# Patient Record
Sex: Female | Born: 1980 | Race: White | Hispanic: No | Smoking: Current every day smoker
Health system: Southern US, Community
[De-identification: ages and names within clinical notes are randomized; demographics above are authoritative.]

## PROBLEM LIST (undated history)

## (undated) HISTORY — PX: CHOLECYSTECTOMY: SHX55

## (undated) HISTORY — PX: APPENDECTOMY: SHX54

## (undated) HISTORY — PX: FOOT SURGERY: SHX648

## (undated) HISTORY — PX: TUBAL LIGATION: SHX77

---

## 2000-02-22 ENCOUNTER — Encounter: Payer: Self-pay | Admitting: Emergency Medicine

## 2000-02-22 ENCOUNTER — Emergency Department (HOSPITAL_COMMUNITY): Admission: EM | Admit: 2000-02-22 | Discharge: 2000-02-22 | Payer: Self-pay | Admitting: Emergency Medicine

## 2000-02-23 ENCOUNTER — Emergency Department (HOSPITAL_COMMUNITY): Admission: EM | Admit: 2000-02-23 | Discharge: 2000-02-23 | Payer: Self-pay | Admitting: Emergency Medicine

## 2000-05-02 ENCOUNTER — Emergency Department (HOSPITAL_COMMUNITY): Admission: EM | Admit: 2000-05-02 | Discharge: 2000-05-02 | Payer: Self-pay | Admitting: Emergency Medicine

## 2000-05-02 ENCOUNTER — Encounter: Payer: Self-pay | Admitting: Emergency Medicine

## 2000-05-03 ENCOUNTER — Emergency Department (HOSPITAL_COMMUNITY): Admission: EM | Admit: 2000-05-03 | Discharge: 2000-05-03 | Payer: Self-pay | Admitting: Emergency Medicine

## 2002-08-04 ENCOUNTER — Emergency Department (HOSPITAL_COMMUNITY): Admission: EM | Admit: 2002-08-04 | Discharge: 2002-08-05 | Payer: Self-pay | Admitting: Emergency Medicine

## 2002-09-04 ENCOUNTER — Inpatient Hospital Stay (HOSPITAL_COMMUNITY): Admission: AD | Admit: 2002-09-04 | Discharge: 2002-09-04 | Payer: Self-pay | Admitting: Obstetrics and Gynecology

## 2002-09-04 ENCOUNTER — Inpatient Hospital Stay (HOSPITAL_COMMUNITY): Admission: AD | Admit: 2002-09-04 | Discharge: 2002-09-04 | Payer: Self-pay | Admitting: *Deleted

## 2002-09-25 ENCOUNTER — Emergency Department (HOSPITAL_COMMUNITY): Admission: EM | Admit: 2002-09-25 | Discharge: 2002-09-25 | Payer: Self-pay | Admitting: Emergency Medicine

## 2002-11-21 ENCOUNTER — Encounter: Payer: Self-pay | Admitting: Family Medicine

## 2002-11-21 ENCOUNTER — Encounter: Admission: RE | Admit: 2002-11-21 | Discharge: 2002-11-21 | Payer: Self-pay | Admitting: Family Medicine

## 2006-11-09 ENCOUNTER — Emergency Department (HOSPITAL_COMMUNITY): Admission: EM | Admit: 2006-11-09 | Discharge: 2006-11-09 | Payer: Self-pay | Admitting: Emergency Medicine

## 2007-04-24 ENCOUNTER — Emergency Department (HOSPITAL_COMMUNITY): Admission: EM | Admit: 2007-04-24 | Discharge: 2007-04-24 | Payer: Self-pay | Admitting: Emergency Medicine

## 2007-08-10 ENCOUNTER — Emergency Department (HOSPITAL_COMMUNITY): Admission: EM | Admit: 2007-08-10 | Discharge: 2007-08-10 | Payer: Self-pay | Admitting: Emergency Medicine

## 2007-09-12 ENCOUNTER — Emergency Department (HOSPITAL_COMMUNITY): Admission: EM | Admit: 2007-09-12 | Discharge: 2007-09-12 | Payer: Self-pay | Admitting: Emergency Medicine

## 2008-07-21 ENCOUNTER — Emergency Department (HOSPITAL_COMMUNITY): Admission: EM | Admit: 2008-07-21 | Discharge: 2008-07-21 | Payer: Self-pay | Admitting: Family Medicine

## 2008-08-12 ENCOUNTER — Ambulatory Visit (HOSPITAL_COMMUNITY): Admission: RE | Admit: 2008-08-12 | Discharge: 2008-08-12 | Payer: Self-pay | Admitting: Obstetrics

## 2008-08-20 ENCOUNTER — Encounter: Admission: RE | Admit: 2008-08-20 | Discharge: 2008-08-20 | Payer: Self-pay | Admitting: Cardiology

## 2008-11-05 ENCOUNTER — Encounter: Admission: RE | Admit: 2008-11-05 | Discharge: 2008-11-05 | Payer: Self-pay | Admitting: General Surgery

## 2009-03-10 ENCOUNTER — Emergency Department (HOSPITAL_COMMUNITY): Admission: EM | Admit: 2009-03-10 | Discharge: 2009-03-10 | Payer: Self-pay | Admitting: Emergency Medicine

## 2009-05-09 ENCOUNTER — Emergency Department (HOSPITAL_COMMUNITY): Admission: EM | Admit: 2009-05-09 | Discharge: 2009-05-09 | Payer: Self-pay | Admitting: Emergency Medicine

## 2010-05-30 IMAGING — US US TRANSVAGINAL NON-OB
1 series · 14 of 25 positions shown · non-contrast
Comparison: None

CLINICAL DATA: Dysfunctional uterine bleeding

TRANSABDOMINAL AND TRANSVAGINAL ULTRASOUND OF PELVIS
TECHNIQUE: Both transabdominal and transvaginal ultrasound
examinations of the pelvis were performed including evaluation of
the uterus, ovaries, adnexal regions, and pelvic cul-de-sac.

[Series 1: us transvaginal non-ob · 14 of 52 slices shown]
[im 1/52]
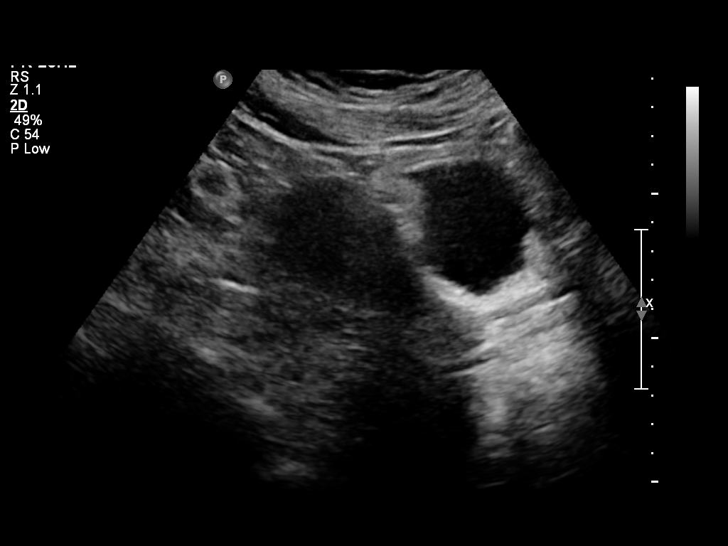
[im 5/52]
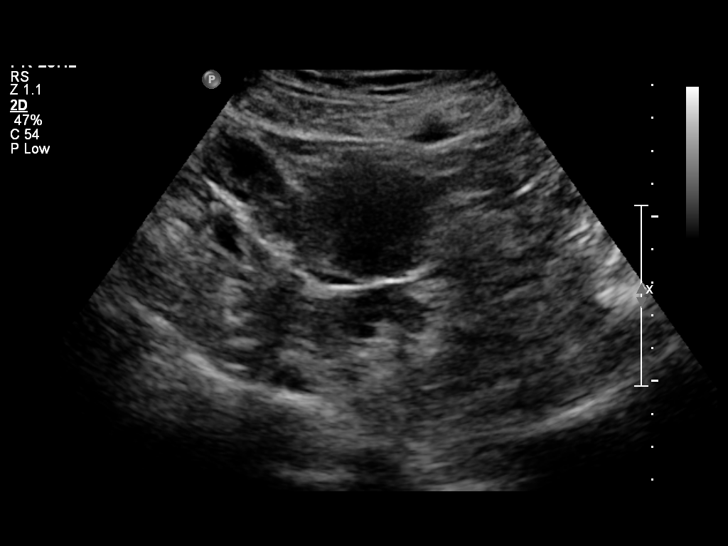
[im 9/52]
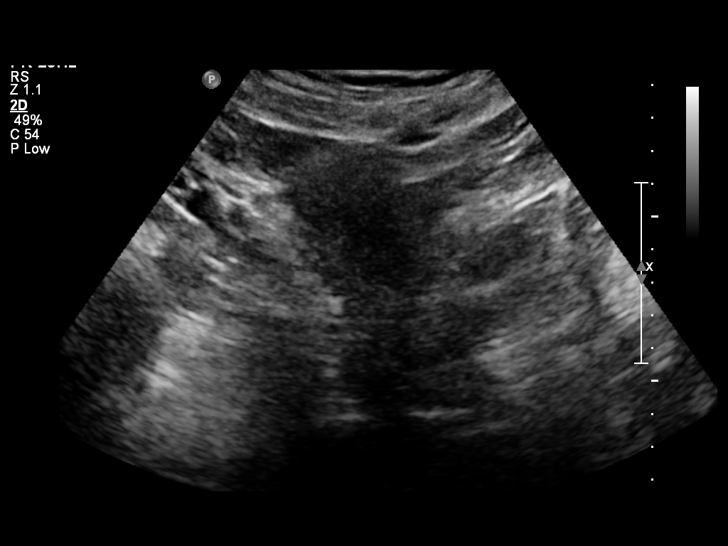
[im 13/52]
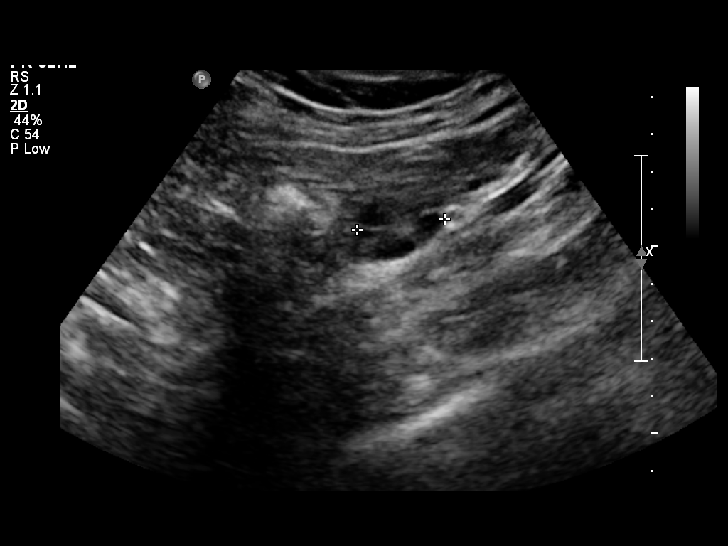
[im 18/52]
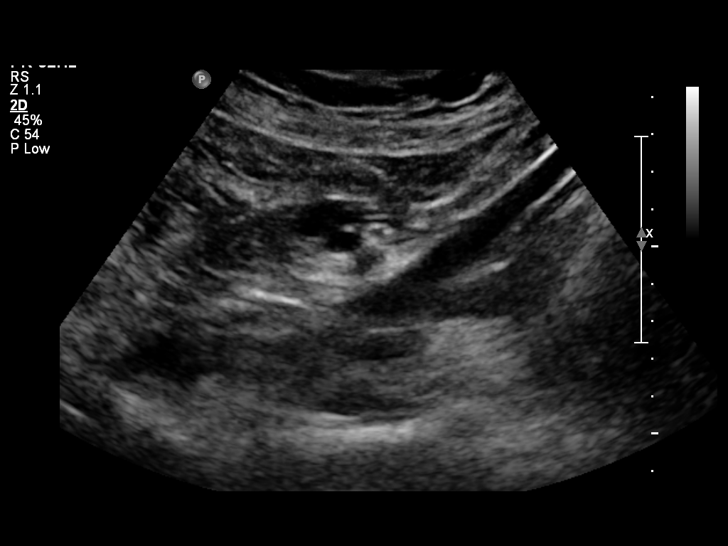
[im 20/52]
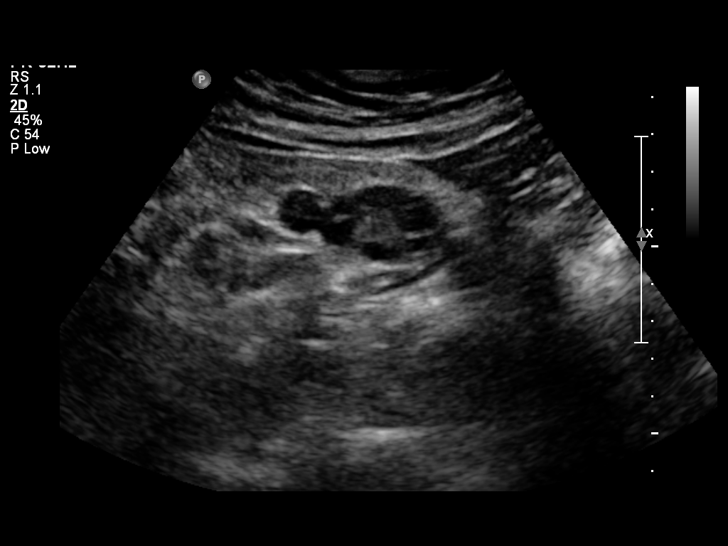
[im 24/52]
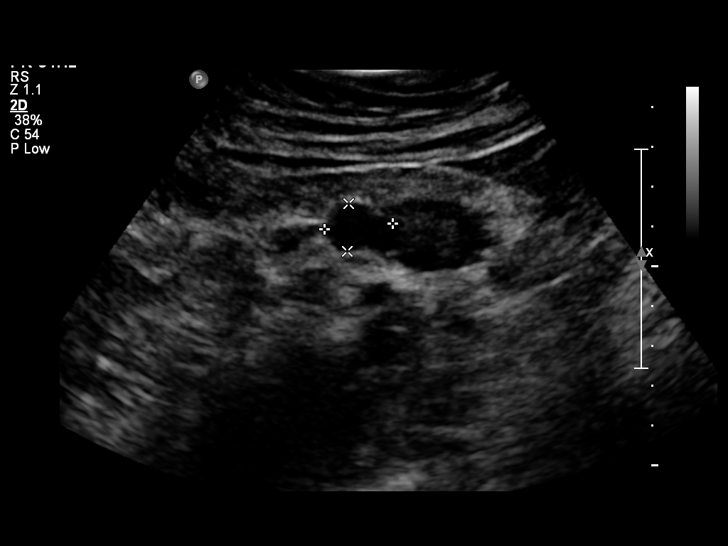
[im 28/52]
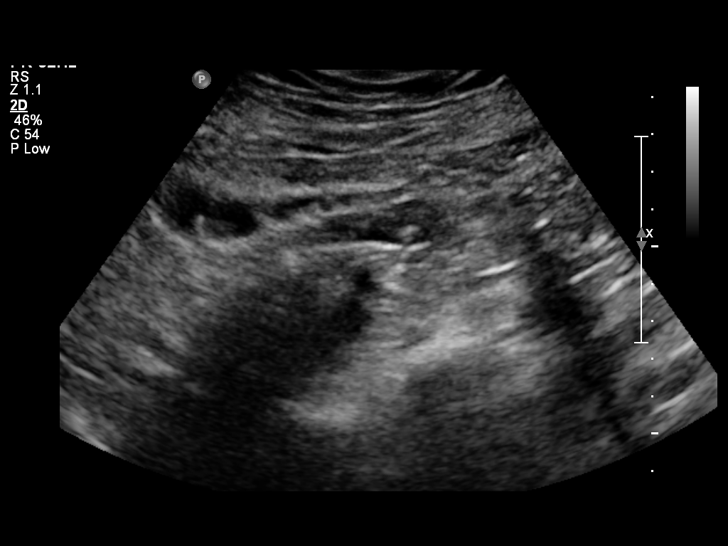
[im 32/52]
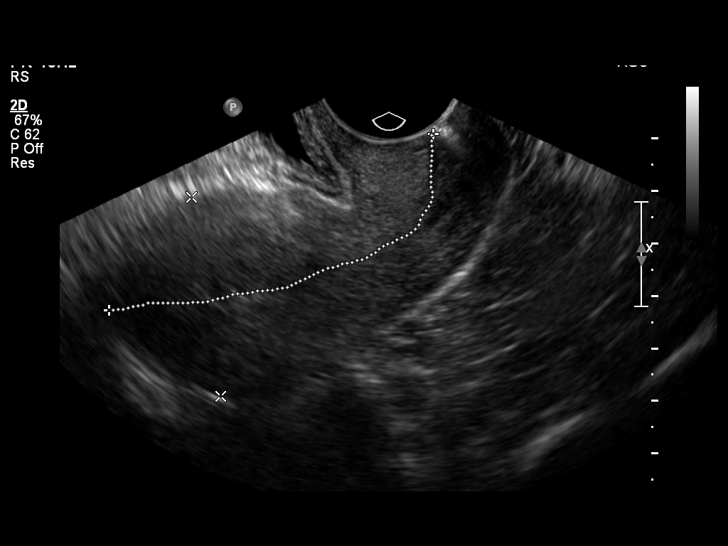
[im 35/52]
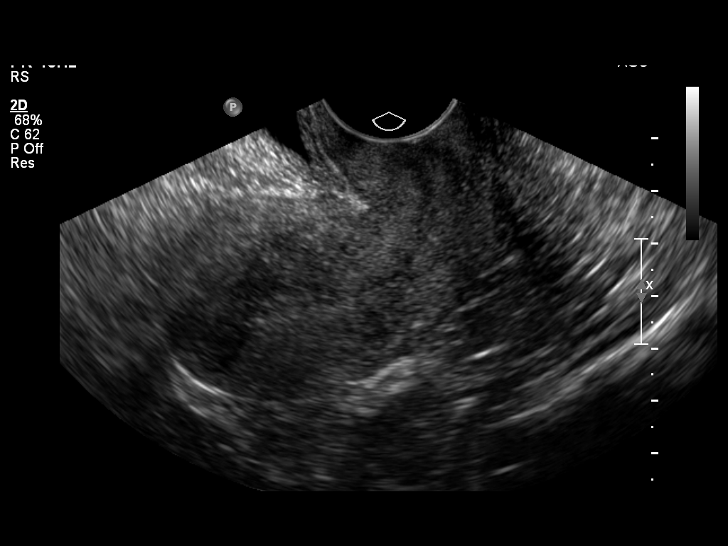
[im 39/52]
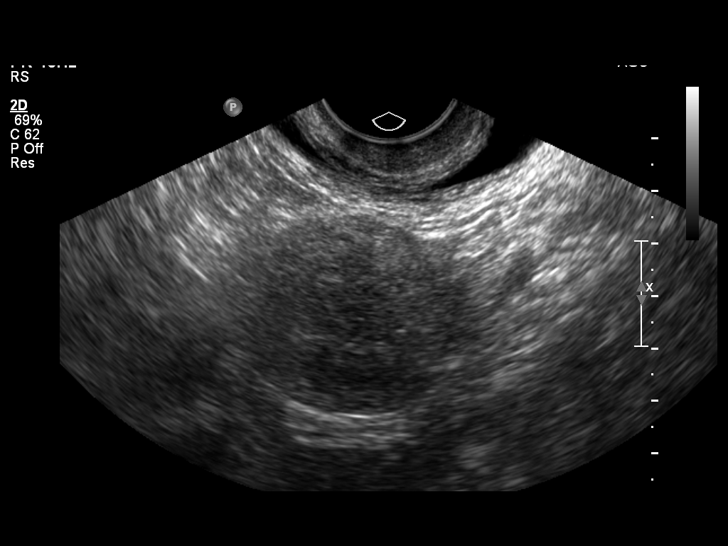
[im 43/52]
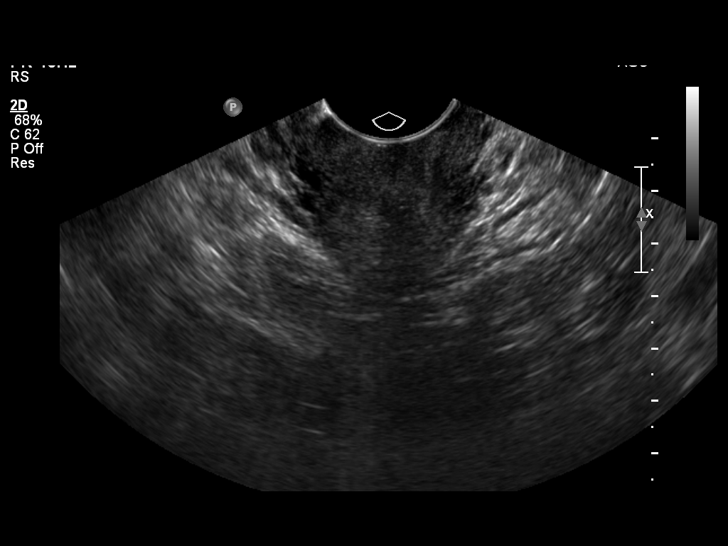
[im 47/52]
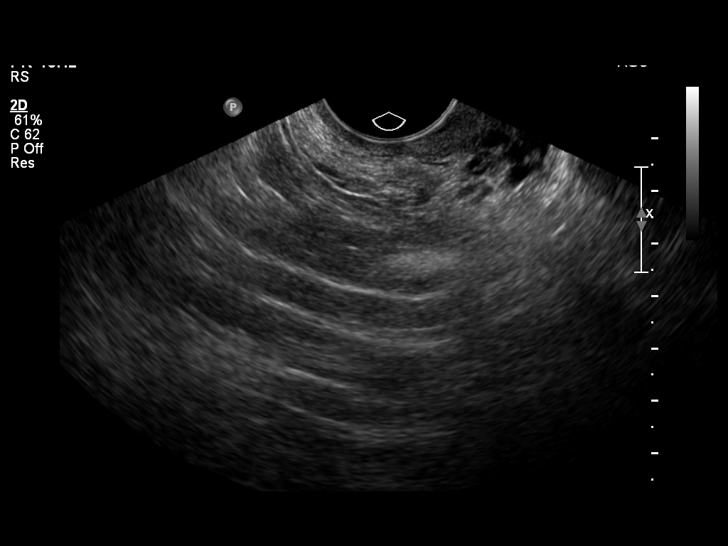
[im 52/52]
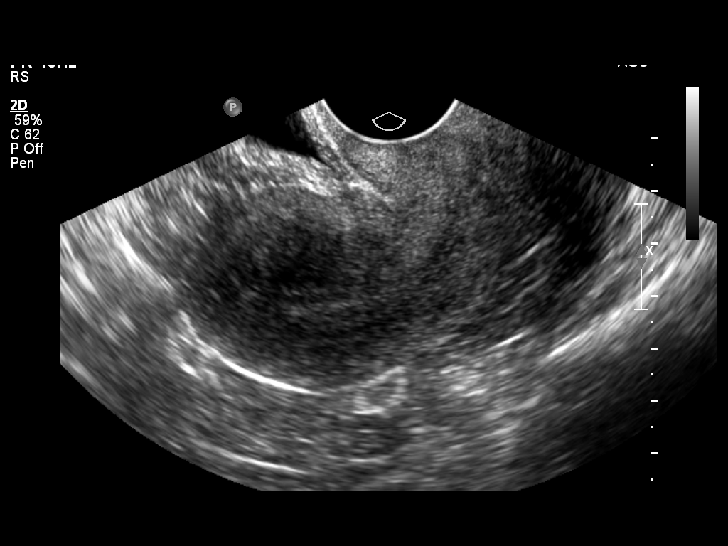

[14 of 25 positions shown; findings below may reference images not displayed]

FINDINGS: The uterus is normal in size, measuring 8.4 x 4.2 x
cm.  The myometrial echotexture is  heterogeneous suggesting an
element of adenomyosis.  The endometrial stripe is within normal
limits, measuring 5 mm in width.  No focal fibroids are seen.

Both ovaries have a normal size and appearance.  The right ovary
measures  3.4 x 2.0 x 2.1 cm, and the left ovary measures 4.4 x
x 2.3 cm.  No adnexal masses or free pelvic fluid are identified.
IMPRESSION: Probable adenomyosis with heterogeneous myometrial echotexture.

## 2010-07-19 ENCOUNTER — Encounter: Payer: Self-pay | Admitting: General Surgery

## 2010-10-01 LAB — POCT I-STAT, CHEM 8
BUN: 5 mg/dL — ABNORMAL LOW (ref 6–23)
Calcium, Ion: 0.95 mmol/L — ABNORMAL LOW (ref 1.12–1.32)
HCT: 43 % (ref 36.0–46.0)
Sodium: 138 mEq/L (ref 135–145)
TCO2: 21 mmol/L (ref 0–100)

## 2010-10-01 LAB — URINALYSIS, ROUTINE W REFLEX MICROSCOPIC
Glucose, UA: NEGATIVE mg/dL
Nitrite: NEGATIVE
Specific Gravity, Urine: 1.028 (ref 1.005–1.030)
pH: 6 (ref 5.0–8.0)

## 2010-10-01 LAB — URINE MICROSCOPIC-ADD ON

## 2010-10-01 LAB — CBC
MCHC: 33.9 g/dL (ref 30.0–36.0)
Platelets: 300 10*3/uL (ref 150–400)
RDW: 13.1 % (ref 11.5–15.5)

## 2010-10-01 LAB — DIFFERENTIAL
Basophils Absolute: 0.1 10*3/uL (ref 0.0–0.1)
Basophils Relative: 1 % (ref 0–1)
Eosinophils Relative: 1 % (ref 0–5)
Lymphocytes Relative: 34 % (ref 12–46)
Monocytes Absolute: 0.6 10*3/uL (ref 0.1–1.0)
Neutro Abs: 6.9 10*3/uL (ref 1.7–7.7)

## 2010-10-01 LAB — PREGNANCY, URINE: Preg Test, Ur: NEGATIVE

## 2010-10-11 LAB — GC/CHLAMYDIA PROBE AMP, GENITAL: Chlamydia, DNA Probe: NEGATIVE

## 2010-10-11 LAB — POCT URINALYSIS DIP (DEVICE)
Ketones, ur: NEGATIVE mg/dL
Protein, ur: NEGATIVE mg/dL
Specific Gravity, Urine: 1.01 (ref 1.005–1.030)
Urobilinogen, UA: 0.2 mg/dL (ref 0.0–1.0)

## 2010-10-11 LAB — WET PREP, GENITAL
Trich, Wet Prep: NONE SEEN
WBC, Wet Prep HPF POC: NONE SEEN

## 2011-03-21 LAB — URINALYSIS, ROUTINE W REFLEX MICROSCOPIC
Bilirubin Urine: NEGATIVE
Nitrite: NEGATIVE
Protein, ur: NEGATIVE
Specific Gravity, Urine: 1.014
Urobilinogen, UA: 1

## 2011-03-21 LAB — DIFFERENTIAL
Lymphocytes Relative: 26
Lymphs Abs: 2.5
Monocytes Absolute: 0.5
Monocytes Relative: 6
Neutro Abs: 6.2
Neutrophils Relative %: 66

## 2011-03-21 LAB — WET PREP, GENITAL
Trich, Wet Prep: NONE SEEN
Yeast Wet Prep HPF POC: NONE SEEN

## 2011-03-21 LAB — COMPREHENSIVE METABOLIC PANEL
Albumin: 3.4 — ABNORMAL LOW
BUN: 4 — ABNORMAL LOW
Calcium: 9
Creatinine, Ser: 0.71
Glucose, Bld: 111 — ABNORMAL HIGH
Potassium: 4.2
Total Protein: 6.3

## 2011-03-21 LAB — CBC
HCT: 41
Hemoglobin: 13.9
MCHC: 33.8
MCV: 84.7
Platelets: 263
RDW: 13.4

## 2011-03-21 LAB — POCT PREGNANCY, URINE: Operator id: 288831

## 2011-03-21 LAB — GC/CHLAMYDIA PROBE AMP, GENITAL
Chlamydia, DNA Probe: NEGATIVE
GC Probe Amp, Genital: NEGATIVE

## 2015-01-25 ENCOUNTER — Encounter (HOSPITAL_COMMUNITY): Payer: Self-pay | Admitting: Emergency Medicine

## 2015-01-25 ENCOUNTER — Emergency Department (HOSPITAL_COMMUNITY): Payer: Medicaid Other

## 2015-01-25 DIAGNOSIS — Z720 Tobacco use: Secondary | ICD-10-CM | POA: Insufficient documentation

## 2015-01-25 DIAGNOSIS — R079 Chest pain, unspecified: Secondary | ICD-10-CM | POA: Insufficient documentation

## 2015-01-25 DIAGNOSIS — R61 Generalized hyperhidrosis: Secondary | ICD-10-CM | POA: Insufficient documentation

## 2015-01-25 LAB — CBC
HEMATOCRIT: 39.9 % (ref 36.0–46.0)
HEMOGLOBIN: 13.2 g/dL (ref 12.0–15.0)
MCH: 27.6 pg (ref 26.0–34.0)
MCHC: 33.1 g/dL (ref 30.0–36.0)
MCV: 83.3 fL (ref 78.0–100.0)
Platelets: 268 10*3/uL (ref 150–400)
RBC: 4.79 MIL/uL (ref 3.87–5.11)
RDW: 14.5 % (ref 11.5–15.5)
WBC: 10.8 10*3/uL — AB (ref 4.0–10.5)

## 2015-01-25 NOTE — ED Notes (Signed)
Pt. reports intermittent central /left chest pain onset last night , denies SOB , no nausea or diaphoresis .

## 2015-01-26 ENCOUNTER — Emergency Department (HOSPITAL_COMMUNITY)
Admission: EM | Admit: 2015-01-26 | Discharge: 2015-01-26 | Disposition: A | Discharge status: Home / Self Care | Payer: Medicaid Other | Attending: Emergency Medicine | Admitting: Emergency Medicine

## 2015-01-26 DIAGNOSIS — R079 Chest pain, unspecified: Secondary | ICD-10-CM

## 2015-01-26 LAB — BASIC METABOLIC PANEL
ANION GAP: 7 (ref 5–15)
BUN: <5 mg/dL — ABNORMAL LOW (ref 6–20)
CALCIUM: 9.2 mg/dL (ref 8.9–10.3)
CO2: 23 mmol/L (ref 22–32)
Chloride: 108 mmol/L (ref 101–111)
Creatinine, Ser: 0.57 mg/dL (ref 0.44–1.00)
GFR calc non Af Amer: >60 mL/min (ref >60)
GLUCOSE: 101 mg/dL — AB (ref 65–99)
Potassium: 3.8 mmol/L (ref 3.5–5.1)
Sodium: 138 mmol/L (ref 135–145)

## 2015-01-26 LAB — I-STAT TROPONIN, ED
TROPONIN I, POC: 0 ng/mL (ref 0.00–0.08)
TROPONIN I, POC: 0 ng/mL (ref 0.00–0.08)

## 2015-01-26 MED ORDER — KETOROLAC TROMETHAMINE 30 MG/ML IJ SOLN
30.0000 mg | Freq: Once | INTRAMUSCULAR | Status: AC
  Administered 2015-01-26: 30 mg via INTRAVENOUS
  Filled 2015-01-26: qty 1

## 2015-01-26 MED ORDER — GI COCKTAIL ~~LOC~~
30.0000 mL | Freq: Once | ORAL | Status: AC
  Administered 2015-01-26: 30 mL via ORAL
  Filled 2015-01-26: qty 30

## 2015-01-26 NOTE — ED Notes (Signed)
Patient left without receiving discharge instructions.

## 2015-01-26 NOTE — ED Notes (Signed)
Patient now denies nausea. Reports emesis was due to gag reflex.

## 2015-01-26 NOTE — ED Provider Notes (Signed)
CSN: [id number]     Arrival date & time 01/25/15  2318 History  This chart was scribed for Mirian Mo, MD by Doreatha Martin, ED Scribe. This patient was seen in room A09C/A09C and the patient's care was started at 12:33 AM.     Chief Complaint  Patient presents with  . Chest Pain   Patient is a 34 y.o. female presenting with chest pain. The history is provided by the patient. No language interpreter was used.  Chest Pain Pain location:  L chest Pain quality: pressure   Pain radiates to:  L arm Pain radiates to the back: no   Pain severity:  Moderate Onset quality:  Gradual Duration:  1 day Timing:  Constant Progression:  Worsening Chronicity:  Recurrent Context: movement   Worsened by:  Certain positions Associated symptoms: diaphoresis   Associated symptoms: no nausea and not vomiting     HPI Comments: DANYEL TOBEY is a 34 y.o. female who presents to the Emergency Department complaining of moderate, constant, gradually worsening left-sided CP onset yesterday and worsened 3 hours ago. Pt states associated heaviness and diaphoresis. She states that the pain started in her left chest and has spread to her left arm. She states that pain is worsened with movement and bending over. Pt states similar pain 4 years ago and felt relief with 2 NTG, she denies any cardiac catheterization. Pt states she was diagnosed with a medication complication. Recent foot surgery 2 years ago. Pt is a current smoker and an occasional drinker. She notes that she uses marijuana. FHx of CHF/CAD<55 from mother and father. Pt has an intolerance to Codeine. She denies nausea, vomiting, diarrhea, constipation, rash.   History reviewed. No pertinent past medical history. Past Surgical History  Procedure Laterality Date  . Cholecystectomy    . Appendectomy    . Tubal ligation    . Foot surgery     No family history on file. History  Substance Use Topics  . Smoking status: Current Every Day Smoker  . Smokeless  tobacco: Not on file  . Alcohol Use: No   OB History    No data available     Review of Systems  Constitutional: Positive for diaphoresis.  Cardiovascular: Positive for chest pain.  Gastrointestinal: Negative for nausea, vomiting, diarrhea and constipation.  Skin: Negative for rash.  All other systems reviewed and are negative.   Allergies  Codeine  Home Medications   Prior to Admission medications   Not on File   BP 114/77 mmHg  Pulse 58  Temp(Src) 97.5 F (36.4 C) (Oral)  Resp 16  SpO2 96%  LMP 12/29/2014 Physical Exam  Constitutional: She is oriented to person, place, and time. She appears well-developed and well-nourished.  HENT:  Head: Normocephalic and atraumatic.  Right Ear: External ear normal.  Left Ear: External ear normal.  Eyes: Conjunctivae and EOM are normal. Pupils are equal, round, and reactive to light.  Neck: Normal range of motion. Neck supple.  Cardiovascular: Normal rate, regular rhythm, normal heart sounds and intact distal pulses.   Pulmonary/Chest: Effort normal and breath sounds normal.  Abdominal: Soft. Bowel sounds are normal. There is no tenderness.  Musculoskeletal: Normal range of motion.  Neurological: She is alert and oriented to person, place, and time.  Skin: Skin is warm and dry.  Vitals reviewed.   ED Course  Procedures (including critical care time) DIAGNOSTIC STUDIES: Oxygen Saturation is 96% on RA, normal by my interpretation.    COORDINATION OF CARE:  12:39 AM Discussed treatment plan with pt at bedside and pt agreed to plan.   Labs Review Labs Reviewed  BASIC METABOLIC PANEL - Abnormal; Notable for the following:    Glucose, Bld 101 (*)    BUN <5 (*)    All other components within normal limits  CBC - Abnormal; Notable for the following:    WBC 10.8 (*)    All other components within normal limits  I-STAT TROPOININ, ED  Rosezena Sensor, ED    Imaging Review Dg Chest 2 View  01/26/2015   CLINICAL DATA:  Chest  pain and discomfort for 1 day.  EXAM: CHEST  2 VIEW  COMPARISON:  12/01/2014  FINDINGS: The cardiomediastinal contours are normal. The lungs are clear. Pulmonary vasculature is normal. No consolidation, pleural effusion, or pneumothorax. No acute osseous abnormalities are seen.  IMPRESSION: No acute pulmonary process.   Electronically Signed   By: Rubye Oaks M.D.   On: 01/26/2015 00:00     EKG Interpretation   Date/Time:  Sunday January 25 2015 23:26:53 EDT Ventricular Rate:  57 PR Interval:  140 QRS Duration: 84 QT Interval:  420 QTC Calculation: 408 R Axis:   66 Text Interpretation:  Sinus bradycardia Otherwise normal ECG No old  tracing to compare Confirmed by Mirian Mo [phone]) on 01/26/2015  2:35:59 AM      MDM   Final diagnoses:  Chest pain, unspecified chest pain type    34 y.o. female with pertinent PMH of family ho MI prior to 34 yo x 2 presents with chest pain as above. History atypical for ACS. Low risk for ACS with the exception of family history. Vital signs and physical exam on arrival as above. Workup demonstrated negative delta troponin. Patient pain free throughout stay. Charge home in stable condition follow-up with cardiology.    I have reviewed all laboratory and imaging studies if ordered as above  1. Chest pain, unspecified chest pain type          Mirian Mo, MD 01/26/15 [phone]

## 2015-01-26 NOTE — ED Notes (Signed)
Patient vomited small amount white emesis. Reports nausea at this time.

## 2015-01-26 NOTE — Discharge Instructions (Signed)

## 2015-09-27 ENCOUNTER — Emergency Department (HOSPITAL_COMMUNITY): Payer: Medicaid Other

## 2015-09-27 ENCOUNTER — Emergency Department (HOSPITAL_COMMUNITY)
Admission: EM | Admit: 2015-09-27 | Discharge: 2015-09-27 | Disposition: A | Discharge status: Home / Self Care | Payer: Medicaid Other | Attending: Emergency Medicine | Admitting: Emergency Medicine

## 2015-09-27 ENCOUNTER — Encounter (HOSPITAL_COMMUNITY): Payer: Self-pay | Admitting: *Deleted

## 2015-09-27 DIAGNOSIS — R0789 Other chest pain: Secondary | ICD-10-CM | POA: Insufficient documentation

## 2015-09-27 DIAGNOSIS — R0600 Dyspnea, unspecified: Secondary | ICD-10-CM | POA: Insufficient documentation

## 2015-09-27 DIAGNOSIS — F17200 Nicotine dependence, unspecified, uncomplicated: Secondary | ICD-10-CM | POA: Diagnosis not present

## 2015-09-27 DIAGNOSIS — R079 Chest pain, unspecified: Secondary | ICD-10-CM

## 2015-09-27 LAB — CBC
HEMATOCRIT: 39.6 % (ref 36.0–46.0)
Hemoglobin: 13 g/dL (ref 12.0–15.0)
MCH: 27.5 pg (ref 26.0–34.0)
MCHC: 32.8 g/dL (ref 30.0–36.0)
MCV: 83.7 fL (ref 78.0–100.0)
PLATELETS: 291 10*3/uL (ref 150–400)
RBC: 4.73 MIL/uL (ref 3.87–5.11)
RDW: 14.2 % (ref 11.5–15.5)
WBC: 9.9 10*3/uL (ref 4.0–10.5)

## 2015-09-27 LAB — BASIC METABOLIC PANEL
ANION GAP: 7 (ref 5–15)
BUN: <5 mg/dL — ABNORMAL LOW (ref 6–20)
CALCIUM: 9.1 mg/dL (ref 8.9–10.3)
CHLORIDE: 108 mmol/L (ref 101–111)
CO2: 24 mmol/L (ref 22–32)
CREATININE: 0.61 mg/dL (ref 0.44–1.00)
GFR calc non Af Amer: >60 mL/min (ref >60)
Glucose, Bld: 110 mg/dL — ABNORMAL HIGH (ref 65–99)
Potassium: 3.7 mmol/L (ref 3.5–5.1)
SODIUM: 139 mmol/L (ref 135–145)

## 2015-09-27 LAB — I-STAT TROPONIN, ED: Troponin i, poc: 0 ng/mL (ref 0.00–0.08)

## 2015-09-27 MED ORDER — MORPHINE SULFATE (PF) 2 MG/ML IV SOLN
2.0000 mg | Freq: Once | INTRAVENOUS | Status: AC
  Administered 2015-09-27: 2 mg via INTRAVENOUS
  Filled 2015-09-27: qty 1

## 2015-09-27 MED ORDER — MORPHINE SULFATE (PF) 2 MG/ML IV SOLN
INTRAVENOUS | Status: AC
  Filled 2015-09-27: qty 1

## 2015-09-27 NOTE — Discharge Instructions (Signed)

## 2015-09-27 NOTE — ED Notes (Addendum)
Pt in from [facility] via [facility], pt reports radiating mid CP to under L breast & L arm intermittent since Nov 2016, pt reports constant CP onset x 2 mths, pt seen [facility] for the same, pt reports pain increasing pain & SOB with exertion, pt denies n/v/d, pt A&O x4, pt rcvd 324 mg ASA & x 2 sL Nitro pta without relief

## 2015-09-27 NOTE — ED Provider Notes (Signed)
CSN: [id number]     Arrival date & time 09/27/15  1555 History   First MD Initiated Contact with Patient 09/27/15 1752     Chief Complaint  Patient presents with  . Chest Pain     (Consider location/radiation/quality/duration/timing/severity/associated sxs/prior Treatment) HPI   This is a 35 year old female with sharp lower sternal chest pain that has been present for 4 months. She reports this has been. She's been seen by her primary care physician and a cardiologist at [facility]. She reports that been unable to find cause of this. She states they've home were discussing looking for a blood clot. She has some dyspnea due to pain. She has not had any recent fever, cough, or change in lead to breathe. She is a smoker. She denies any history of lung disease. She is not currently taking any medication for this.  History reviewed. No pertinent past medical history. Past Surgical History  Procedure Laterality Date  . Cholecystectomy    . Appendectomy    . Tubal ligation    . Foot surgery     No family history on file. Social History  Substance Use Topics  . Smoking status: Current Every Day Smoker  . Smokeless tobacco: None  . Alcohol Use: No   OB History    No data available     Review of Systems  All other systems reviewed and are negative.     Allergies  Codeine  Home Medications   Prior to Admission medications   Not on File   BP 131/74 mmHg  Pulse 70  Temp(Src) 99.5 F (37.5 C) (Oral)  Resp 18  SpO2 98% Physical Exam  Constitutional: She is oriented to person, place, and time. She appears well-developed and well-nourished.  HENT:  Head: Normocephalic and atraumatic.  Right Ear: External ear normal.  Left Ear: External ear normal.  Nose: Nose normal.  Mouth/Throat: Oropharynx is clear and moist.  Eyes: Conjunctivae and EOM are normal. Pupils are equal, round, and reactive to light.  Neck: Normal range of motion. Neck supple.  Cardiovascular: Normal rate,  regular rhythm, normal heart sounds and intact distal pulses.   Pulmonary/Chest: Effort normal and breath sounds normal.  Abdominal: Soft. Bowel sounds are normal.  Musculoskeletal: Normal range of motion.  Neurological: She is alert and oriented to person, place, and time. She has normal reflexes.  Skin: Skin is warm and dry.  Psychiatric: She has a normal mood and affect. Her behavior is normal. Judgment and thought content normal.  Nursing note and vitals reviewed.   ED Course  Procedures (including critical care time) Labs Review Labs Reviewed  BASIC METABOLIC PANEL - Abnormal; Notable for the following:    Glucose, Bld 110 (*)    BUN <5 (*)    All other components within normal limits  CBC  I-STAT TROPOININ, ED    Imaging Review Dg Chest 2 View  09/27/2015  CLINICAL DATA:  Progressively worsening chest pain 5 months. EXAM: CHEST  2 VIEW COMPARISON:  07/17/2015 FINDINGS: Lungs are hypoinflated without consolidation or effusion. Cardiomediastinal silhouette, bones and soft tissues are within normal. IMPRESSION: Hypoinflation without acute cardiopulmonary disease. Electronically Signed   By: Elberta Fortis M.D.   On: 09/27/2015 16:55   I have personally reviewed and evaluated these images and lab results as part of my medical decision-making.   EKG Interpretation   Date/Time:  Sunday September 27 2015 15:57:51 EDT Ventricular Rate:  69 PR Interval:  148 QRS Duration: 84 QT Interval:  406 QTC Calculation: 435 R Axis:   45 Text Interpretation:  Normal sinus rhythm Normal ECG Confirmed by Mina Babula MD,  Duwayne HeckANIELLE ([id number]) on 09/27/2015 5:53:29 PM      MDM   Final diagnoses:  Chest pain, unspecified chest pain type    35 year old female who presents with 4 months of chest pain. EKG is normal and troponin is normal. Chest x-Evette Diclemente is clear. I do not think there is any acute cardiopulmonary etiology. Her abdomen is soft and does not appear to be an intra-abdominal process.    Margarita Grizzleanielle  Sholom Dulude, MD 09/27/15 1816

## 2015-09-27 NOTE — ED Notes (Signed)
Pt continues to c/o CP, pt placed in triage room & placed on heart monitor
